# Patient Record
Sex: Female | Born: 2005 | Race: Black or African American | Hispanic: No | Marital: Single | Smoking: Never smoker
Health system: Southern US, Community
[De-identification: ages and names within clinical notes are randomized; demographics above are authoritative.]

---
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
## 2024-02-22 ENCOUNTER — Telehealth: Payer: Self-pay | Admitting: Pediatrics

## 2024-02-22 NOTE — Telephone Encounter (Signed)
 Called main number on file to schedule wcc na lvm

## 2024-03-05 ENCOUNTER — Encounter: Payer: Self-pay | Admitting: Pediatrics

## 2024-03-22 ENCOUNTER — Ambulatory Visit (INDEPENDENT_AMBULATORY_CARE_PROVIDER_SITE_OTHER): Admitting: Family

## 2024-03-22 ENCOUNTER — Encounter: Payer: Self-pay | Admitting: Family

## 2024-03-22 ENCOUNTER — Telehealth: Payer: Self-pay

## 2024-03-22 ENCOUNTER — Other Ambulatory Visit (HOSPITAL_COMMUNITY)
Admission: RE | Admit: 2024-03-22 | Discharge: 2024-03-22 | Disposition: A | Discharge status: Home / Self Care | Source: Ambulatory Visit | Attending: Family | Admitting: Family

## 2024-03-22 VITALS — BP 129/79 | HR 85 | Ht 63.39 in | Wt 179.8 lb

## 2024-03-22 DIAGNOSIS — Z113 Encounter for screening for infections with a predominantly sexual mode of transmission: Secondary | ICD-10-CM | POA: Diagnosis not present

## 2024-03-22 DIAGNOSIS — Z0001 Encounter for general adult medical examination with abnormal findings: Secondary | ICD-10-CM | POA: Diagnosis not present

## 2024-03-22 DIAGNOSIS — Z114 Encounter for screening for human immunodeficiency virus [HIV]: Secondary | ICD-10-CM

## 2024-03-22 DIAGNOSIS — Z00121 Encounter for routine child health examination with abnormal findings: Secondary | ICD-10-CM

## 2024-03-22 DIAGNOSIS — R635 Abnormal weight gain: Secondary | ICD-10-CM

## 2024-03-22 DIAGNOSIS — G479 Sleep disorder, unspecified: Secondary | ICD-10-CM | POA: Diagnosis not present

## 2024-03-22 DIAGNOSIS — Z68.41 Body mass index (BMI) pediatric, 85th percentile to less than 95th percentile for age: Secondary | ICD-10-CM | POA: Diagnosis not present

## 2024-03-22 DIAGNOSIS — L906 Striae atrophicae: Secondary | ICD-10-CM

## 2024-03-22 DIAGNOSIS — E559 Vitamin D deficiency, unspecified: Secondary | ICD-10-CM

## 2024-03-22 NOTE — Telephone Encounter (Signed)
 Patient called nurse line and left a VM returning a phone call. She states to call her back please.
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
## 2024-03-23 ENCOUNTER — Encounter: Payer: Self-pay | Admitting: Family

## 2024-03-23 LAB — THYROID PANEL WITH TSH
Free Thyroxine Index: 2 (ref 1.4–3.8)
T3 Uptake: 36 % — ABNORMAL HIGH (ref 22–35)
T4, Total: 5.5 ug/dL (ref 5.3–11.7)
TSH: 1.81 m[IU]/L

## 2024-03-23 LAB — COMPREHENSIVE METABOLIC PANEL WITH GFR
AG Ratio: 1.7 (calc) (ref 1.0–2.5)
ALT: 10 U/L (ref 5–32)
AST: 13 U/L (ref 12–32)
Albumin: 4.3 g/dL (ref 3.6–5.1)
Alkaline phosphatase (APISO): 66 U/L (ref 36–128)
BUN: 13 mg/dL (ref 7–20)
CO2: 24 mmol/L (ref 20–32)
Calcium: 9.2 mg/dL (ref 8.9–10.4)
Chloride: 107 mmol/L (ref 98–110)
Creat: 0.85 mg/dL (ref 0.50–0.96)
Globulin: 2.5 g/dL (ref 2.0–3.8)
Glucose, Bld: 92 mg/dL (ref 65–99)
Potassium: 4.1 mmol/L (ref 3.8–5.1)
Sodium: 139 mmol/L (ref 135–146)
Total Bilirubin: 0.5 mg/dL (ref 0.2–1.1)
Total Protein: 6.8 g/dL (ref 6.3–8.2)
eGFR: 101 mL/min/{1.73_m2} (ref >=60)

## 2024-03-23 LAB — CBC WITH DIFFERENTIAL/PLATELET
Absolute Lymphocytes: 2336 {cells}/uL (ref 850–3900)
Absolute Monocytes: 459 {cells}/uL (ref 200–950)
Basophils Absolute: 31 {cells}/uL (ref 0–200)
Basophils Relative: 0.6 %
Eosinophils Absolute: 82 {cells}/uL (ref 15–500)
Eosinophils Relative: 1.6 %
HCT: 42.9 % (ref 35.0–45.0)
Hemoglobin: 13.1 g/dL (ref 11.7–15.5)
MCH: 25.2 pg — ABNORMAL LOW (ref 27.0–33.0)
MCHC: 30.5 g/dL — ABNORMAL LOW (ref 32.0–36.0)
MCV: 82.7 fL (ref 80.0–100.0)
MPV: 10.6 fL (ref 7.5–12.5)
Monocytes Relative: 9 %
Neutro Abs: 2193 {cells}/uL (ref 1500–7800)
Neutrophils Relative %: 43 %
Platelets: 253 10*3/uL (ref 140–400)
RBC: 5.19 10*6/uL — ABNORMAL HIGH (ref 3.80–5.10)
RDW: 13 % (ref 11.0–15.0)
Total Lymphocyte: 45.8 %
WBC: 5.1 10*3/uL (ref 3.8–10.8)

## 2024-03-23 LAB — LIPID PANEL
Cholesterol: 144 mg/dL (ref <170)
HDL: 57 mg/dL (ref >45)
LDL Cholesterol (Calc): 74 mg/dL (ref <110)
Non-HDL Cholesterol (Calc): 87 mg/dL (ref <120)
Total CHOL/HDL Ratio: 2.5 (calc) (ref <5.0)
Triglycerides: 46 mg/dL (ref <90)

## 2024-03-23 LAB — URINE CYTOLOGY ANCILLARY ONLY
Chlamydia: NEGATIVE
Comment: NEGATIVE
Comment: NORMAL
Neisseria Gonorrhea: NEGATIVE

## 2024-03-23 LAB — HEMOGLOBIN A1C
Hgb A1c MFr Bld: 5.3 % (ref <5.7)
Mean Plasma Glucose: 105 mg/dL
eAG (mmol/L): 5.8 mmol/L

## 2024-03-23 LAB — VITAMIN D 25 HYDROXY (VIT D DEFICIENCY, FRACTURES): Vit D, 25-Hydroxy: 21 ng/mL — ABNORMAL LOW (ref 30–100)

## 2024-03-25 ENCOUNTER — Ambulatory Visit: Payer: Self-pay | Admitting: Family

## 2024-03-25 ENCOUNTER — Other Ambulatory Visit: Payer: Self-pay | Admitting: Family

## 2024-03-25 MED ORDER — VITAMIN D (ERGOCALCIFEROL) 1.25 MG (50000 UNIT) PO CAPS: 50000.0000 [IU] | ORAL_CAPSULE | ORAL | 0 refills | Status: DC

## 2024-03-29 ENCOUNTER — Other Ambulatory Visit: Payer: Self-pay | Admitting: Family

## 2024-03-29 ENCOUNTER — Telehealth: Payer: Self-pay

## 2024-03-29 MED ORDER — VITAMIN D (ERGOCALCIFEROL) 1.25 MG (50000 UNIT) PO CAPS: 50000.0000 [IU] | ORAL_CAPSULE | ORAL | 0 refills | Status: AC

## 2024-03-29 NOTE — Telephone Encounter (Signed)
 Called [facility] to see if beta Hcg (pregnancy blood test) could be added to patients blood sample. Per [facility] they are not able to add test. Spoke with patient went over lab results patient verbally understood results and agree to start on Vitamin D  supplements. Patient would [facility] prescription [facility] [address]. Tried to schedule appointment for urine drop off for UPT, patient states that she will call back to schedule appointment after she is able to find transportation to come in.

## 2024-04-04 ENCOUNTER — Telehealth: Payer: Self-pay

## 2024-04-17 DIAGNOSIS — X58XXXA Exposure to other specified factors, initial encounter: Secondary | ICD-10-CM | POA: Diagnosis not present

## 2024-04-17 DIAGNOSIS — Z3202 Encounter for pregnancy test, result negative: Secondary | ICD-10-CM | POA: Diagnosis not present

## 2024-04-17 DIAGNOSIS — N946 Dysmenorrhea, unspecified: Secondary | ICD-10-CM | POA: Diagnosis not present

## 2024-04-17 DIAGNOSIS — N939 Abnormal uterine and vaginal bleeding, unspecified: Secondary | ICD-10-CM | POA: Diagnosis not present

## 2024-04-17 DIAGNOSIS — T887XXA Unspecified adverse effect of drug or medicament, initial encounter: Secondary | ICD-10-CM | POA: Diagnosis not present

## 2024-05-23 ENCOUNTER — Other Ambulatory Visit: Payer: Self-pay | Admitting: Family

## 2024-07-22 ENCOUNTER — Ambulatory Visit
Admission: EM | Admit: 2024-07-22 | Discharge: 2024-07-22 | Disposition: A | Discharge status: Home / Self Care | Attending: Emergency Medicine | Admitting: Emergency Medicine

## 2024-07-22 ENCOUNTER — Other Ambulatory Visit: Payer: Self-pay

## 2024-07-22 DIAGNOSIS — R1084 Generalized abdominal pain: Secondary | ICD-10-CM | POA: Diagnosis not present

## 2024-07-22 DIAGNOSIS — R519 Headache, unspecified: Secondary | ICD-10-CM | POA: Diagnosis not present

## 2024-07-22 LAB — POCT URINE DIPSTICK
Blood, UA: NEGATIVE
Glucose, UA: NEGATIVE mg/dL
Nitrite, UA: NEGATIVE
Spec Grav, UA: >=1.03 — AB (ref 1.010–1.025)
Urobilinogen, UA: 0.2 U/dL
pH, UA: 5.5 (ref 5.0–8.0)

## 2024-07-22 LAB — POCT URINE PREGNANCY: Preg Test, Ur: NEGATIVE

## 2024-07-22 MED ORDER — ASPIRIN-ACETAMINOPHEN-CAFFEINE 250-250-65 MG PO TABS: 1.0000 | ORAL_TABLET | Freq: Four times a day (QID) | ORAL | 0 refills | Status: DC | PRN

## 2024-07-22 MED ORDER — IBUPROFEN 600 MG PO TABS: 600.0000 mg | ORAL_TABLET | Freq: Four times a day (QID) | ORAL | 0 refills | Status: AC | PRN

## 2024-07-22 NOTE — ED Triage Notes (Signed)
 Pt presents with complaints of abdominal cramping and migraines. States her menstrual cycle is in two weeks. Unsure of what is causing the cramping. Last BM was yesterday and it was normal. Cramping has been intermittent for months as well as the migraines. OTC Aspirin + Midol  taken at home with temporary relief. Pt is crying in triage. Reports she is needing a therapist to talk with.

## 2024-07-22 NOTE — Discharge Instructions (Signed)
 You can take the ibuprofen  as needed for abdominal pain.  Try the Excedrin Migraine or ibuprofen  as needed for headache.  Ensure you are drinking at least 64 ounces of water daily, your urine looked a little dehydrated.  We are sending this off for culture and we will contact you if antibiotics are needed.  Follow-up with your primary care provider for ongoing management of your headaches and abdominal discomfort.  Seek immediate care for any new or urgent symptoms.  For any urgent behavioral health needs you can go to the behavioral health center.

## 2024-07-22 NOTE — ED Provider Notes (Addendum)
 [facility]    CSN: [id number] Arrival date & time: 07/22/24  1436      History   Chief Complaint Chief Complaint  Patient presents with   Abdominal Cramping   Migraine    HPI Briana Wilson is a 18 y.o. female.   Patient presents to clinic over concern of generalized abdominal cramping and abdominal pain.  She is also having headaches.  Has been having irregular periods since discontinuing Depo-Provera  some months ago.  Is sexually active, uses a condom.  Last menstrual cycle was on 06/16/24 patient denies chance of pregnancy.  Has not had any vaginal discharge or odor.  Denies urinary urgency, frequency or dysuria.  Abdominal cramping has been intermittent for the past few months.  Recently the cramping has gotten worse.  Did try Midol  at home and this seemed to help for a few hours, then cramps returned worse than before.  Has not had any nausea or vomiting.  Denies diarrhea.  Denies fever.  Denies flank pain.  Feeling a little bloated.   Occasional wine use. Denies drug use.   Has been having ongoing migraine headaches after a fall where she hit her head playing sports in seventh grade.  Reports these are chronic, ongoing migraines.  Feels like her typical headache.  She is tearful in triage with nursing staff.  Had previously been with a therapist who she talked to and felt this very beneficial.  Was lost to follow-up a year or 2 ago.  The history is provided by the patient and medical records.  Abdominal Cramping  Migraine    History reviewed. No pertinent past medical history.  Patient Active Problem List   Diagnosis Date Noted   Human papilloma virus (HPV) vaccination declined 04/03/2020   Acne 04/04/2014    History reviewed. No pertinent surgical history.  OB History   No obstetric history on file.      Home Medications    Prior to Admission medications   Medication Sig Start Date End Date Taking? Authorizing Provider   aspirin-acetaminophen -caffeine (EXCEDRIN MIGRAINE) 250-250-65 MG tablet Take 1 tablet by mouth every 6 (six) hours as needed for headache. 07/22/24  Yes Arvis Miguez  N, FNP  ibuprofen  (ADVIL ) 600 MG tablet Take 1 tablet (600 mg total) by mouth every 6 (six) hours as needed. 07/22/24  Yes Sarahbeth Cashin  N, FNP  Vitamin D , Ergocalciferol , (DRISDOL ) 1.25 MG (50000 UNIT) CAPS capsule Take 1 capsule (50,000 Units total) by mouth every 7 (seven) days. 03/29/24   Joshua Bari HERO, NP    Family History History reviewed. No pertinent family history.  Social History Social History   Tobacco Use   Smoking status: Never   Smokeless tobacco: Never  Vaping Use   Vaping status: Never Used  Substance Use Topics   Alcohol use: Never   Drug use: Never     Allergies   Patient has no known allergies.   Review of Systems Review of Systems  Per HPI  Physical Exam Triage Vital Signs ED Triage Vitals  Encounter Vitals Group     BP 07/22/24 1535 127/80     Girls Systolic BP Percentile --      Girls Diastolic BP Percentile --      Boys Systolic BP Percentile --      Boys Diastolic BP Percentile --      Pulse Rate 07/22/24 1535 92     Resp 07/22/24 1535 18     Temp 07/22/24 1535 98.3 F (36.8 C)  Temp Source 07/22/24 1535 Oral     SpO2 07/22/24 1535 96 %     Weight 07/22/24 1533 180 lb (81.6 kg)     Height 07/22/24 1533 5' 3.5 (1.613 m)     Head Circumference --      Peak Flow --      Pain Score 07/22/24 1531 0     Pain Loc --      Pain Education --      Exclude from Growth Chart --    No data found.  Updated Vital Signs BP 127/80 (BP Location: Right Arm)   Pulse 92   Temp 98.3 F (36.8 C) (Oral)   Resp 18   Ht 5' 3.5 (1.613 m)   Wt 180 lb (81.6 kg)   LMP 06/16/2024 (Exact Date)   SpO2 96%   BMI 31.39 kg/m   Visual Acuity Right Eye Distance:   Left Eye Distance:   Bilateral Distance:    Right Eye Near:   Left Eye Near:    Bilateral Near:     Physical  Exam Vitals and nursing note reviewed.  Constitutional:      Appearance: Normal appearance.  HENT:     Head: Normocephalic and atraumatic.     Right Ear: External ear normal.     Left Ear: External ear normal.     Nose: Nose normal.     Mouth/Throat:     Mouth: Mucous membranes are moist.  Eyes:     Conjunctiva/sclera: Conjunctivae normal.  Cardiovascular:     Rate and Rhythm: Normal rate.  Pulmonary:     Effort: Pulmonary effort is normal. No respiratory distress.  Abdominal:     General: Abdomen is flat. Bowel sounds are normal.     Palpations: Abdomen is soft.     Tenderness: There is abdominal tenderness in the suprapubic area. There is no guarding or rebound.  Musculoskeletal:        General: Normal range of motion.  Skin:    General: Skin is warm and dry.  Neurological:     General: No focal deficit present.     Mental Status: She is alert and oriented to person, place, and time.  Psychiatric:        Mood and Affect: Mood normal.        Behavior: Behavior normal.      UC Treatments / Results  Labs (all labs ordered are listed, but only abnormal results are displayed) Labs Reviewed  POCT URINE DIPSTICK - Abnormal; Notable for the following components:      Result Value   Color, UA straw (*)    Clarity, UA cloudy (*)    Bilirubin, UA small (*)    Ketones, POC UA trace (5) (*)    Spec Grav, UA >=1.030 (*)    Leukocytes, UA Trace (*)    All other components within normal limits  URINE CULTURE  POCT URINE PREGNANCY    EKG   Radiology No results found.  Procedures Procedures (including critical care time)  Medications Ordered in UC Medications - No data to display  Initial Impression / Assessment and Plan / UC Course  I have reviewed the triage vital signs and the nursing notes.  Pertinent labs & imaging results that were available during my care of the patient were reviewed by me and considered in my medical decision making (see chart for  details).  Vitals and triage reviewed, patient is hemodynamically stable.  Abdomen is soft with active bowel  sounds, mild suprapubic tenderness.  Low concern for acute abdomen at this time.  Denies urinary symptoms.  Urine is cloudy with bilirubin, trace ketones and high specific gravity.  Trace leukocytes.  Will send for culture.  Suspect dehydration.  Urine pregnancy negative.  Headaches seem to be related to stress, ongoing and chronic.  Will trial Excedrin Migraine.  Ibuprofen  as needed for abdominal pain.  Encouraged primary care follow-up.  Without active SI or HI, behavioral urgent care follow-up as needed.  Plan of care, follow-up care, return precautions given, no questions at this time.    Final Clinical Impressions(s) / UC Diagnoses   Final diagnoses:  Generalized abdominal pain  Bad headache     Discharge Instructions      You can take the ibuprofen  as needed for abdominal pain.  Try the Excedrin Migraine or ibuprofen  as needed for headache.  Ensure you are drinking at least 64 ounces of water daily, your urine looked a little dehydrated.  We are sending this off for culture and we will contact you if antibiotics are needed.  Follow-up with your primary care provider for ongoing management of your headaches and abdominal discomfort.  Seek immediate care for any new or urgent symptoms.  For any urgent behavioral health needs you can go to the behavioral health center.      ED Prescriptions     Medication Sig Dispense Auth. Provider   aspirin-acetaminophen -caffeine (EXCEDRIN MIGRAINE) 250-250-65 MG tablet Take 1 tablet by mouth every 6 (six) hours as needed for headache. 30 tablet Dreama, Lesslie Mossa  N, FNP   ibuprofen  (ADVIL ) 600 MG tablet Take 1 tablet (600 mg total) by mouth every 6 (six) hours as needed. 30 tablet Dreama, Charlese Gruetzmacher  N, FNP      PDMP not reviewed this encounter.   Dreama Charles SAILOR, FNP 07/22/24 1637    Dreama, Ade Stmarie  N, FNP 07/22/24  [phone]

## 2024-07-23 LAB — URINE CULTURE: Culture: <10000 — AB

## 2024-07-26 ENCOUNTER — Ambulatory Visit (HOSPITAL_COMMUNITY): Payer: Self-pay

## 2024-08-12 ENCOUNTER — Ambulatory Visit: Admitting: Nurse Practitioner

## 2024-09-15 ENCOUNTER — Ambulatory Visit (INDEPENDENT_AMBULATORY_CARE_PROVIDER_SITE_OTHER)

## 2024-09-15 ENCOUNTER — Ambulatory Visit
Admission: EM | Admit: 2024-09-15 | Discharge: 2024-09-15 | Disposition: A | Discharge status: Home / Self Care | Attending: Nurse Practitioner | Admitting: Nurse Practitioner

## 2024-09-15 DIAGNOSIS — S63501A Unspecified sprain of right wrist, initial encounter: Secondary | ICD-10-CM | POA: Diagnosis not present

## 2024-09-15 DIAGNOSIS — S161XXA Strain of muscle, fascia and tendon at neck level, initial encounter: Secondary | ICD-10-CM

## 2024-09-15 DIAGNOSIS — M25531 Pain in right wrist: Secondary | ICD-10-CM

## 2024-09-15 DIAGNOSIS — G44311 Acute post-traumatic headache, intractable: Secondary | ICD-10-CM | POA: Diagnosis not present

## 2024-09-15 DIAGNOSIS — S29012A Strain of muscle and tendon of back wall of thorax, initial encounter: Secondary | ICD-10-CM

## 2024-09-15 LAB — POCT URINE PREGNANCY: Preg Test, Ur: NEGATIVE

## 2024-09-15 MED ORDER — BUTALBITAL-APAP-CAFFEINE 50-325-40 MG PO TABS: 1.0000 | ORAL_TABLET | Freq: Four times a day (QID) | ORAL | 0 refills | Status: AC | PRN

## 2024-09-15 MED ORDER — METHOCARBAMOL 500 MG PO TABS: 500.0000 mg | ORAL_TABLET | Freq: Three times a day (TID) | ORAL | 0 refills | Status: AC

## 2024-09-15 MED ORDER — NAPROXEN 500 MG PO TABS: 500.0000 mg | ORAL_TABLET | Freq: Two times a day (BID) | ORAL | 0 refills | Status: AC
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
## 2024-09-21 ENCOUNTER — Encounter: Payer: Self-pay | Admitting: Nurse Practitioner

## 2024-09-21 ENCOUNTER — Ambulatory Visit (INDEPENDENT_AMBULATORY_CARE_PROVIDER_SITE_OTHER): Admitting: Nurse Practitioner

## 2024-09-21 ENCOUNTER — Telehealth: Payer: Self-pay | Admitting: Nurse Practitioner

## 2024-09-21 VITALS — BP 102/76 | HR 76 | Temp 97.3°F | Ht 63.5 in | Wt 197.2 lb

## 2024-09-21 DIAGNOSIS — N946 Dysmenorrhea, unspecified: Secondary | ICD-10-CM | POA: Diagnosis not present

## 2024-09-21 DIAGNOSIS — M542 Cervicalgia: Secondary | ICD-10-CM

## 2024-09-21 DIAGNOSIS — M25531 Pain in right wrist: Secondary | ICD-10-CM

## 2024-09-21 MED ORDER — MEDROXYPROGESTERONE ACETATE 150 MG/ML IM SUSP
150.0000 mg | Freq: Once | INTRAMUSCULAR | Status: AC
  Administered 2024-09-21: 11:00:00 150 mg via INTRAMUSCULAR

## 2024-09-21 NOTE — Patient Instructions (Signed)
 It was great to see you!  We will start depo injection again every 3 months   Keep taking naproxen  and robaxin  as needed for pain and stiffness   Let's follow-up in 3 months, sooner if you have concerns.  If a referral was placed today, you will be contacted for an appointment. Please note that routine referrals can sometimes take up to 3-4 weeks to process. Please call our office if you haven't heard anything after this time frame.  Take care,  Tinnie Harada, NP
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
## 2024-09-21 NOTE — Assessment & Plan Note (Signed)
 Chronic, ongoing. She noticed this started after stopping depo provera  injections last year. She would like to restart this to help with cramping during periods. Will schedule for injections every 3 months. Can take ibuprofen  as needed for cramping. Recent pregnancy test negative a few days ago.

## 2024-09-28 NOTE — Telephone Encounter (Signed)
 Error

## 2024-12-14 ENCOUNTER — Encounter: Admitting: Nurse Practitioner
